# Patient Record
Sex: Female | Born: 1975 | Race: Black or African American | Hispanic: No | Marital: Single | Smoking: Never smoker
Health system: Southern US, Community
[De-identification: ages and names within clinical notes are randomized; demographics above are authoritative.]

## PROBLEM LIST (undated history)

## (undated) DIAGNOSIS — K259 Gastric ulcer, unspecified as acute or chronic, without hemorrhage or perforation: Secondary | ICD-10-CM

---

## 2000-01-12 ENCOUNTER — Emergency Department (HOSPITAL_COMMUNITY): Admission: EM | Admit: 2000-01-12 | Discharge: 2000-01-12 | Payer: Self-pay | Admitting: Emergency Medicine

## 2000-07-24 ENCOUNTER — Emergency Department (HOSPITAL_COMMUNITY): Admission: EM | Admit: 2000-07-24 | Discharge: 2000-07-24 | Payer: Self-pay | Admitting: Emergency Medicine

## 2000-08-18 ENCOUNTER — Emergency Department (HOSPITAL_COMMUNITY): Admission: EM | Admit: 2000-08-18 | Discharge: 2000-08-18 | Payer: Self-pay | Admitting: Emergency Medicine

## 2000-11-02 ENCOUNTER — Emergency Department (HOSPITAL_COMMUNITY): Admission: EM | Admit: 2000-11-02 | Discharge: 2000-11-02 | Payer: Self-pay | Admitting: Emergency Medicine

## 2001-01-12 ENCOUNTER — Emergency Department (HOSPITAL_COMMUNITY): Admission: EM | Admit: 2001-01-12 | Discharge: 2001-01-12 | Payer: Self-pay | Admitting: Emergency Medicine

## 2001-04-14 ENCOUNTER — Encounter: Payer: Self-pay | Admitting: Emergency Medicine

## 2001-04-14 ENCOUNTER — Emergency Department (HOSPITAL_COMMUNITY): Admission: EM | Admit: 2001-04-14 | Discharge: 2001-04-14 | Payer: Self-pay | Admitting: Emergency Medicine

## 2001-08-31 ENCOUNTER — Emergency Department (HOSPITAL_COMMUNITY): Admission: EM | Admit: 2001-08-31 | Discharge: 2001-09-01 | Payer: Self-pay | Admitting: Emergency Medicine

## 2001-09-01 ENCOUNTER — Encounter: Payer: Self-pay | Admitting: Emergency Medicine

## 2002-03-08 ENCOUNTER — Emergency Department (HOSPITAL_COMMUNITY): Admission: EM | Admit: 2002-03-08 | Discharge: 2002-03-08 | Payer: Self-pay | Admitting: *Deleted

## 2002-05-20 ENCOUNTER — Emergency Department (HOSPITAL_COMMUNITY): Admission: EM | Admit: 2002-05-20 | Discharge: 2002-05-20 | Payer: Self-pay | Admitting: Emergency Medicine

## 2002-09-19 ENCOUNTER — Encounter: Payer: Self-pay | Admitting: Emergency Medicine

## 2002-09-19 ENCOUNTER — Emergency Department (HOSPITAL_COMMUNITY): Admission: EM | Admit: 2002-09-19 | Discharge: 2002-09-19 | Payer: Self-pay | Admitting: Emergency Medicine

## 2002-09-29 ENCOUNTER — Encounter: Payer: Self-pay | Admitting: Emergency Medicine

## 2002-09-29 ENCOUNTER — Emergency Department (HOSPITAL_COMMUNITY): Admission: EM | Admit: 2002-09-29 | Discharge: 2002-09-29 | Payer: Self-pay | Admitting: Emergency Medicine

## 2002-12-02 ENCOUNTER — Emergency Department (HOSPITAL_COMMUNITY): Admission: EM | Admit: 2002-12-02 | Discharge: 2002-12-02 | Payer: Self-pay | Admitting: Emergency Medicine

## 2002-12-02 ENCOUNTER — Encounter: Payer: Self-pay | Admitting: Emergency Medicine

## 2003-02-15 ENCOUNTER — Emergency Department (HOSPITAL_COMMUNITY): Admission: EM | Admit: 2003-02-15 | Discharge: 2003-02-16 | Payer: Self-pay | Admitting: Emergency Medicine

## 2003-06-10 ENCOUNTER — Emergency Department (HOSPITAL_COMMUNITY): Admission: EM | Admit: 2003-06-10 | Discharge: 2003-06-10 | Payer: Self-pay | Admitting: Emergency Medicine

## 2003-06-10 ENCOUNTER — Encounter: Payer: Self-pay | Admitting: Emergency Medicine

## 2003-06-28 ENCOUNTER — Emergency Department (HOSPITAL_COMMUNITY): Admission: EM | Admit: 2003-06-28 | Discharge: 2003-06-29 | Payer: Self-pay

## 2003-08-18 ENCOUNTER — Emergency Department (HOSPITAL_COMMUNITY): Admission: EM | Admit: 2003-08-18 | Discharge: 2003-08-18 | Payer: Self-pay | Admitting: Emergency Medicine

## 2003-08-18 ENCOUNTER — Encounter: Payer: Self-pay | Admitting: Emergency Medicine

## 2003-11-01 ENCOUNTER — Emergency Department (HOSPITAL_COMMUNITY): Admission: EM | Admit: 2003-11-01 | Discharge: 2003-11-01 | Payer: Self-pay | Admitting: Emergency Medicine

## 2003-11-02 ENCOUNTER — Emergency Department (HOSPITAL_COMMUNITY): Admission: EM | Admit: 2003-11-02 | Discharge: 2003-11-02 | Payer: Self-pay | Admitting: Emergency Medicine

## 2004-02-01 ENCOUNTER — Emergency Department (HOSPITAL_COMMUNITY): Admission: EM | Admit: 2004-02-01 | Discharge: 2004-02-01 | Payer: Self-pay | Admitting: Emergency Medicine

## 2004-03-16 ENCOUNTER — Emergency Department (HOSPITAL_COMMUNITY): Admission: EM | Admit: 2004-03-16 | Discharge: 2004-03-16 | Payer: Self-pay | Admitting: Emergency Medicine

## 2004-03-19 ENCOUNTER — Emergency Department (HOSPITAL_COMMUNITY): Admission: EM | Admit: 2004-03-19 | Discharge: 2004-03-20 | Payer: Self-pay

## 2005-01-17 ENCOUNTER — Emergency Department (HOSPITAL_COMMUNITY): Admission: EM | Admit: 2005-01-17 | Discharge: 2005-01-17 | Payer: Self-pay | Admitting: Emergency Medicine

## 2005-04-13 ENCOUNTER — Inpatient Hospital Stay (HOSPITAL_COMMUNITY): Admission: AD | Admit: 2005-04-13 | Discharge: 2005-04-13 | Payer: Self-pay | Admitting: *Deleted

## 2005-04-15 ENCOUNTER — Ambulatory Visit (HOSPITAL_COMMUNITY): Admission: RE | Admit: 2005-04-15 | Discharge: 2005-04-15 | Payer: Self-pay | Admitting: *Deleted

## 2005-08-15 ENCOUNTER — Emergency Department (HOSPITAL_COMMUNITY): Admission: EM | Admit: 2005-08-15 | Discharge: 2005-08-15 | Payer: Self-pay | Admitting: Emergency Medicine

## 2005-10-12 ENCOUNTER — Emergency Department (HOSPITAL_COMMUNITY): Admission: EM | Admit: 2005-10-12 | Discharge: 2005-10-12 | Payer: Self-pay | Admitting: Emergency Medicine

## 2005-12-03 ENCOUNTER — Emergency Department (HOSPITAL_COMMUNITY): Admission: EM | Admit: 2005-12-03 | Discharge: 2005-12-03 | Payer: Self-pay | Admitting: Emergency Medicine

## 2006-01-12 ENCOUNTER — Emergency Department (HOSPITAL_COMMUNITY): Admission: EM | Admit: 2006-01-12 | Discharge: 2006-01-12 | Payer: Self-pay | Admitting: Emergency Medicine

## 2006-03-30 ENCOUNTER — Emergency Department (HOSPITAL_COMMUNITY): Admission: EM | Admit: 2006-03-30 | Discharge: 2006-03-30 | Payer: Self-pay | Admitting: Emergency Medicine

## 2006-12-22 ENCOUNTER — Emergency Department (HOSPITAL_COMMUNITY): Admission: EM | Admit: 2006-12-22 | Discharge: 2006-12-22 | Payer: Self-pay | Admitting: Emergency Medicine

## 2007-01-30 ENCOUNTER — Emergency Department (HOSPITAL_COMMUNITY): Admission: EM | Admit: 2007-01-30 | Discharge: 2007-01-30 | Payer: Self-pay | Admitting: Emergency Medicine

## 2008-01-27 ENCOUNTER — Emergency Department (HOSPITAL_COMMUNITY): Admission: EM | Admit: 2008-01-27 | Discharge: 2008-01-27 | Payer: Self-pay | Admitting: Family Medicine

## 2008-04-05 ENCOUNTER — Emergency Department (HOSPITAL_COMMUNITY): Admission: EM | Admit: 2008-04-05 | Discharge: 2008-04-05 | Payer: Self-pay | Admitting: Emergency Medicine

## 2008-05-08 ENCOUNTER — Emergency Department (HOSPITAL_COMMUNITY): Admission: EM | Admit: 2008-05-08 | Discharge: 2008-05-09 | Payer: Self-pay | Admitting: Emergency Medicine

## 2009-01-17 ENCOUNTER — Emergency Department (HOSPITAL_COMMUNITY): Admission: EM | Admit: 2009-01-17 | Discharge: 2009-01-18 | Payer: Self-pay | Admitting: Family Medicine

## 2009-02-23 ENCOUNTER — Emergency Department (HOSPITAL_COMMUNITY): Admission: EM | Admit: 2009-02-23 | Discharge: 2009-02-23 | Payer: Self-pay | Admitting: Emergency Medicine

## 2009-05-26 ENCOUNTER — Inpatient Hospital Stay (HOSPITAL_COMMUNITY): Admission: AD | Admit: 2009-05-26 | Discharge: 2009-05-26 | Payer: Self-pay | Admitting: Obstetrics & Gynecology

## 2009-10-08 ENCOUNTER — Emergency Department (HOSPITAL_COMMUNITY): Admission: EM | Admit: 2009-10-08 | Discharge: 2009-10-09 | Payer: Self-pay | Admitting: Emergency Medicine

## 2009-11-24 ENCOUNTER — Emergency Department (HOSPITAL_COMMUNITY): Admission: EM | Admit: 2009-11-24 | Discharge: 2009-11-24 | Payer: Self-pay | Admitting: Emergency Medicine

## 2011-03-07 LAB — RAPID STREP SCREEN (MED CTR MEBANE ONLY): Streptococcus, Group A Screen (Direct): NEGATIVE

## 2011-03-09 LAB — POCT CARDIAC MARKERS
CKMB, poc: <1 ng/mL — ABNORMAL LOW (ref 1.0–8.0)
Myoglobin, poc: 36.2 ng/mL (ref 12–200)
Troponin i, poc: <0.05 ng/mL (ref 0.00–0.09)

## 2011-03-09 LAB — POCT I-STAT, CHEM 8
BUN: 8 mg/dL (ref 6–23)
Calcium, Ion: 1.18 mmol/L (ref 1.12–1.32)
Chloride: 104 mEq/L (ref 96–112)
Creatinine, Ser: 0.6 mg/dL (ref 0.4–1.2)

## 2011-03-14 LAB — COMPREHENSIVE METABOLIC PANEL
BUN: 8 mg/dL (ref 6–23)
CO2: 27 mEq/L (ref 19–32)
Chloride: 105 mEq/L (ref 96–112)
GFR calc Af Amer: >60 mL/min (ref >60)
Glucose, Bld: 104 mg/dL — ABNORMAL HIGH (ref 70–99)
Potassium: 3.7 mEq/L (ref 3.5–5.1)
Sodium: 138 mEq/L (ref 135–145)
Total Bilirubin: 0.7 mg/dL (ref 0.3–1.2)
Total Protein: 7.3 g/dL (ref 6.0–8.3)

## 2011-03-14 LAB — URINALYSIS, ROUTINE W REFLEX MICROSCOPIC
Bilirubin Urine: NEGATIVE
Glucose, UA: NEGATIVE mg/dL
Hgb urine dipstick: NEGATIVE
Nitrite: NEGATIVE
Specific Gravity, Urine: >1.03 — ABNORMAL HIGH (ref 1.005–1.030)
pH: 5 (ref 5.0–8.0)

## 2011-03-14 LAB — CBC
Hemoglobin: 12.4 g/dL (ref 12.0–15.0)
MCHC: 32.3 g/dL (ref 30.0–36.0)
Platelets: 252 10*3/uL (ref 150–400)

## 2011-03-22 LAB — POCT CARDIAC MARKERS
CKMB, poc: 1.1 ng/mL (ref 1.0–8.0)
Myoglobin, poc: 32.1 ng/mL (ref 12–200)
Myoglobin, poc: 39.8 ng/mL (ref 12–200)
Troponin i, poc: <0.05 ng/mL (ref 0.00–0.09)

## 2011-03-22 LAB — DIFFERENTIAL
Basophils Absolute: 0 10*3/uL (ref 0.0–0.1)
Eosinophils Relative: 2 % (ref 0–5)
Neutro Abs: 3.3 10*3/uL (ref 1.7–7.7)
Neutrophils Relative %: 48 % (ref 43–77)

## 2011-03-22 LAB — CBC
HCT: 36.2 % (ref 36.0–46.0)
MCHC: 32.5 g/dL (ref 30.0–36.0)
MCV: 77.3 fL — ABNORMAL LOW (ref 78.0–100.0)
Platelets: 260 10*3/uL (ref 150–400)
RDW: 14.1 % (ref 11.5–15.5)

## 2011-03-22 LAB — POCT PREGNANCY, URINE: Preg Test, Ur: NEGATIVE

## 2011-03-22 LAB — URINE MICROSCOPIC-ADD ON

## 2011-03-22 LAB — URINALYSIS, ROUTINE W REFLEX MICROSCOPIC
Bilirubin Urine: NEGATIVE
Hgb urine dipstick: NEGATIVE
Nitrite: NEGATIVE
pH: 7 (ref 5.0–8.0)

## 2011-03-22 LAB — URINE CULTURE

## 2011-03-22 LAB — POCT I-STAT, CHEM 8
Chloride: 106 mEq/L (ref 96–112)
Creatinine, Ser: 0.7 mg/dL (ref 0.4–1.2)
HCT: 40 % (ref 36.0–46.0)
Hemoglobin: 13.6 g/dL (ref 12.0–15.0)
Potassium: 4.6 mEq/L (ref 3.5–5.1)
Sodium: 140 mEq/L (ref 135–145)

## 2011-08-26 LAB — POCT URINALYSIS DIP (DEVICE)
Bilirubin Urine: NEGATIVE
Ketones, ur: NEGATIVE
Operator id: 270961
Specific Gravity, Urine: 1.025

## 2011-08-26 LAB — POCT PREGNANCY, URINE: Operator id: 270961

## 2011-09-01 LAB — RAPID STREP SCREEN (MED CTR MEBANE ONLY): Streptococcus, Group A Screen (Direct): NEGATIVE

## 2014-11-01 ENCOUNTER — Emergency Department (HOSPITAL_COMMUNITY)
Admission: EM | Admit: 2014-11-01 | Discharge: 2014-11-02 | Disposition: A | Discharge status: Home / Self Care | Payer: Managed Care, Other (non HMO) | Attending: Emergency Medicine | Admitting: Emergency Medicine

## 2014-11-01 ENCOUNTER — Emergency Department (HOSPITAL_COMMUNITY): Payer: Managed Care, Other (non HMO)

## 2014-11-01 ENCOUNTER — Encounter (HOSPITAL_COMMUNITY): Payer: Self-pay | Admitting: Emergency Medicine

## 2014-11-01 DIAGNOSIS — K920 Hematemesis: Secondary | ICD-10-CM | POA: Diagnosis not present

## 2014-11-01 DIAGNOSIS — K921 Melena: Secondary | ICD-10-CM

## 2014-11-01 DIAGNOSIS — R109 Unspecified abdominal pain: Secondary | ICD-10-CM

## 2014-11-01 DIAGNOSIS — R197 Diarrhea, unspecified: Secondary | ICD-10-CM | POA: Insufficient documentation

## 2014-11-01 DIAGNOSIS — Z3202 Encounter for pregnancy test, result negative: Secondary | ICD-10-CM | POA: Insufficient documentation

## 2014-11-01 DIAGNOSIS — Z79899 Other long term (current) drug therapy: Secondary | ICD-10-CM | POA: Diagnosis not present

## 2014-11-01 HISTORY — DX: Gastric ulcer, unspecified as acute or chronic, without hemorrhage or perforation: K25.9

## 2014-11-01 LAB — COMPREHENSIVE METABOLIC PANEL
ALT: 25 U/L (ref 0–35)
AST: 21 U/L (ref 0–37)
Albumin: 3.7 g/dL (ref 3.5–5.2)
Alkaline Phosphatase: 70 U/L (ref 39–117)
Anion gap: 13 (ref 5–15)
BUN: 8 mg/dL (ref 6–23)
CALCIUM: 9.2 mg/dL (ref 8.4–10.5)
CO2: 24 mEq/L (ref 19–32)
CREATININE: 0.68 mg/dL (ref 0.50–1.10)
Chloride: 106 mEq/L (ref 96–112)
GFR calc Af Amer: >90 mL/min (ref >90)
Glucose, Bld: 82 mg/dL (ref 70–99)
Potassium: 3.9 mEq/L (ref 3.7–5.3)
SODIUM: 143 meq/L (ref 137–147)
TOTAL PROTEIN: 7.4 g/dL (ref 6.0–8.3)
Total Bilirubin: 0.3 mg/dL (ref 0.3–1.2)

## 2014-11-01 LAB — CBC WITH DIFFERENTIAL/PLATELET
BASOS ABS: 0 10*3/uL (ref 0.0–0.1)
BASOS PCT: 0 % (ref 0–1)
EOS ABS: 0.1 10*3/uL (ref 0.0–0.7)
EOS PCT: 1 % (ref 0–5)
HEMATOCRIT: 36.8 % (ref 36.0–46.0)
Hemoglobin: 11.3 g/dL — ABNORMAL LOW (ref 12.0–15.0)
Lymphocytes Relative: 29 % (ref 12–46)
Lymphs Abs: 2.8 10*3/uL (ref 0.7–4.0)
MCH: 23.6 pg — AB (ref 26.0–34.0)
MCHC: 30.7 g/dL (ref 30.0–36.0)
MCV: 76.8 fL — AB (ref 78.0–100.0)
MONO ABS: 0.7 10*3/uL (ref 0.1–1.0)
Monocytes Relative: 7 % (ref 3–12)
Neutro Abs: 6.3 10*3/uL (ref 1.7–7.7)
Neutrophils Relative %: 63 % (ref 43–77)
PLATELETS: 305 10*3/uL (ref 150–400)
RBC: 4.79 MIL/uL (ref 3.87–5.11)
RDW: 14.2 % (ref 11.5–15.5)
WBC: 9.9 10*3/uL (ref 4.0–10.5)

## 2014-11-01 LAB — LIPASE, BLOOD: LIPASE: 31 U/L (ref 11–59)

## 2014-11-01 MED ORDER — SODIUM CHLORIDE 0.9 % IV BOLUS (SEPSIS)
1000.0000 mL | INTRAVENOUS | Status: AC
  Administered 2014-11-02: 1000 mL via INTRAVENOUS

## 2014-11-01 MED ORDER — IOHEXOL 300 MG/ML  SOLN
50.0000 mL | Freq: Once | INTRAMUSCULAR | Status: AC | PRN
Start: 2014-11-01 — End: 2014-11-01
  Administered 2014-11-01: 50 mL via ORAL

## 2014-11-01 MED ORDER — ONDANSETRON HCL 4 MG/2ML IJ SOLN
4.0000 mg | Freq: Once | INTRAMUSCULAR | Status: AC
  Administered 2014-11-02: 4 mg via INTRAVENOUS
  Filled 2014-11-01: qty 2

## 2014-11-01 MED ORDER — HYDROMORPHONE HCL 1 MG/ML IJ SOLN
0.5000 mg | Freq: Once | INTRAMUSCULAR | Status: AC
  Administered 2014-11-02: 0.5 mg via INTRAVENOUS
  Filled 2014-11-01: qty 1

## 2014-11-01 NOTE — ED Notes (Signed)
Pt states she episodes today of loose stool and vomiting both with dark blood. L abd pain.

## 2014-11-01 NOTE — ED Provider Notes (Signed)
CSN: [id number]     Arrival date & time 11/01/14  2226 History   First MD Initiated Contact with Patient 11/01/14 2306     Chief Complaint  Patient presents with  . Hematemesis  . Rectal Bleeding     (Consider location/radiation/quality/duration/timing/severity/associated sxs/prior Treatment) Patient is a 38 y.o. female presenting with hematochezia. The history is provided by the patient.  Rectal Bleeding Quality:  Maroon Amount:  Scant Duration:  1 day Timing: once. Progression:  Unchanged Chronicity:  New Context: diarrhea and spontaneously   Relieved by:  Nothing Worsened by:  Nothing tried Ineffective treatments:  None tried Associated symptoms: abdominal pain and vomiting   Associated symptoms: no dizziness and no fever   Abdominal pain:    Location:  LUQ   Quality:  Sharp   Severity:  Moderate   Onset quality:  Gradual   Duration:  6 months   Timing:  Intermittent   Progression:  Unchanged   Chronicity:  Chronic   Past Medical History  Diagnosis Date  . Multiple gastric ulcers    No past surgical history on file. No family history on file. History  Substance Use Topics  . Smoking status: Never Smoker   . Smokeless tobacco: Not on file  . Alcohol Use: No   OB History    No data available     Review of Systems  Constitutional: Negative for fever and fatigue.  HENT: Negative for congestion and drooling.   Eyes: Negative for pain.  Respiratory: Negative for cough and shortness of breath.   Cardiovascular: Negative for chest pain.  Gastrointestinal: Positive for nausea, vomiting, abdominal pain, diarrhea and hematochezia.  Genitourinary: Negative for dysuria and hematuria.       Blood in emesis and stool  Musculoskeletal: Negative for back pain, gait problem and neck pain.  Skin: Negative for color change.  Neurological: Negative for dizziness and headaches.  Hematological: Negative for adenopathy.  Psychiatric/Behavioral: Negative for behavioral  problems.  All other systems reviewed and are negative.     Allergies  Food  Home Medications   Prior to Admission medications   Medication Sig Start Date End Date Taking? Authorizing Provider  ibuprofen (ADVIL,MOTRIN) 200 MG tablet Take 400 mg by mouth every 6 (six) hours as needed (for pain.).   Yes Historical Provider, MD  Multiple Vitamin (MULTIVITAMIN WITH MINERALS) TABS tablet Take 1 tablet by mouth daily.   Yes Historical Provider, MD   BP 149/85 mmHg  Pulse 86  Temp(Src) 98.1 F (36.7 C) (Oral)  Resp 18  Ht 5\' 2"  (1.575 m)  Wt 200 lb (90.719 kg)  BMI 36.57 kg/m2  SpO2 99%  LMP 10/13/2014 Physical Exam  Constitutional: She is oriented to person, place, and time. She appears well-developed and well-nourished.  HENT:  Head: Normocephalic and atraumatic.  Mouth/Throat: Oropharynx is clear and moist. No oropharyngeal exudate.  Eyes: Conjunctivae and EOM are normal. Pupils are equal, round, and reactive to light.  Neck: Normal range of motion. Neck supple.  Cardiovascular: Normal rate, regular rhythm, normal heart sounds and intact distal pulses.  Exam reveals no gallop and no friction rub.   No murmur heard. Pulmonary/Chest: Effort normal and breath sounds normal. No respiratory distress. She has no wheezes.  Abdominal: Soft. Bowel sounds are normal. There is tenderness (mild tenderness to palpation of the left upper and mid quadrant.). There is no rebound and no guarding.  Genitourinary:  Normal-appearing external rectum. Normal rectal exam. No stool on the glove just some rectal  mucus which is nonbloody in appearance. Hemoccult negative.  Musculoskeletal: Normal range of motion. She exhibits no edema or tenderness.  Neurological: She is alert and oriented to person, place, and time.  Skin: Skin is warm and dry.  Psychiatric: She has a normal mood and affect. Her behavior is normal.  Nursing note and vitals reviewed.   ED Course  Procedures (including critical care  time) Labs Review Labs Reviewed  CBC WITH DIFFERENTIAL - Abnormal; Notable for the following:    Hemoglobin 11.3 (*)    MCV 76.8 (*)    MCH 23.6 (*)    All other components within normal limits  URINALYSIS, ROUTINE W REFLEX MICROSCOPIC - Abnormal; Notable for the following:    Leukocytes, UA TRACE (*)    All other components within normal limits  URINE MICROSCOPIC-ADD ON - Abnormal; Notable for the following:    Squamous Epithelial / LPF FEW (*)    All other components within normal limits  COMPREHENSIVE METABOLIC PANEL  LIPASE, BLOOD  OCCULT BLOOD X 1 CARD TO LAB, STOOL  POC URINE PREG, ED    Imaging Review Ct Abdomen Pelvis W Contrast  11/02/2014   CLINICAL DATA:  Acute onset of left-sided abdominal pain, with episode of vomiting. Several episodes of loose stool. Hematemesis and hematochezia. Initial encounter.  EXAM: CT ABDOMEN AND PELVIS WITH CONTRAST  TECHNIQUE: Multidetector CT imaging of the abdomen and pelvis was performed using the standard protocol following bolus administration of intravenous contrast.  CONTRAST:  50mL OMNIPAQUE IOHEXOL 300 MG/ML SOLN, 100mL OMNIPAQUE IOHEXOL 300 MG/ML SOLN  COMPARISON:  Pelvic ultrasound performed 04/15/2005  FINDINGS: The visualized lung bases are clear.  The liver and spleen are unremarkable in appearance. The gallbladder is within normal limits. The pancreas and adrenal glands are unremarkable.  The kidneys are unremarkable in appearance. There is no evidence of hydronephrosis. No renal or ureteral stones are seen. No perinephric stranding is appreciated.  No free fluid is identified. The small bowel is unremarkable in appearance. The stomach is within normal limits. No acute vascular abnormalities are seen.  The appendix is normal in caliber, without evidence for appendicitis. Contrast progresses to the level of the proximal transverse colon. The colon is unremarkable in appearance.  The bladder is mildly distended and grossly unremarkable.  There is a somewhat unusual heterogeneous appearance to the cervix. Would correlate with Pap smear results. There is suggestion of a fibroid at the uterine fundus, difficult to fully characterize. The ovaries are relatively symmetric. No suspicious adnexal masses are seen. No inguinal lymphadenopathy is seen.  No acute osseous abnormalities are identified.  IMPRESSION: 1. No acute abnormality seen within the abdomen or pelvis. 2. Somewhat unusual heterogeneous appearance to the cervix; a mass cannot be entirely excluded. Would correlate with Pap smear results. 3. Suggestion of fibroid at the uterine fundus.   Electronically Signed   By: Roanna RaiderJeffery  Chang M.D.   On: 11/02/2014 02:02     EKG Interpretation None      MDM   Final diagnoses:  Left sided abdominal pain  Hematemesis with nausea  Bloody stool    11:19 PM 38 y.o. female who presents with left-sided abdominal pain, hematemesis, and blood in her stool. She states that she has had intermittent left-sided abdominal pain for the last 6 months. She states that she had an episode this afternoon and afterwards had several episodes of diarrhea. One of which was green in appearance and one with a small amount of dark red blood. She  also had 2 episodes of emesis, one of which had a small amount of dark red blood. She is afebrile and vital signs are unremarkable here. We'll get screening labs and imaging.  Hemoccult negative.  3:41 AM: I interpreted/reviewed the labs and/or imaging which were non-contributory.  She has not had any vomiting or bloody stools here. She was incidentally found to have a heterogeneous appearance of her cervix. I recommended that she follow up routinely with OB/GYN for a Pap smear. Also recommended that she follow-up with GI if she has ongoing blood in her stool. I have discussed the diagnosis/risks/treatment options with the patient and believe the pt to be eligible for discharge home to follow-up with GI and obgyn. We also  discussed returning to the ED immediately if new or worsening sx occur. We discussed the sx which are most concerning (e.g., worsening bloody stools or hematemesis, worsening abd pain, fever) that necessitate immediate return. Medications administered to the patient during their visit and any new prescriptions provided to the patient are listed below.  Medications given during this visit Medications  sodium chloride 0.9 % bolus 1,000 mL (1,000 mLs Intravenous New Bag/Given 11/02/14 0129)  ondansetron (ZOFRAN) injection 4 mg (4 mg Intravenous Given 11/02/14 0130)  HYDROmorphone (DILAUDID) injection 0.5 mg (0.5 mg Intravenous Given 11/02/14 0132)  iohexol (OMNIPAQUE) 300 MG/ML solution 50 mL (50 mLs Oral Contrast Given 11/01/14 2343)  iohexol (OMNIPAQUE) 300 MG/ML solution 100 mL (100 mLs Intravenous Contrast Given 11/02/14 0147)    New Prescriptions   OMEPRAZOLE (PRILOSEC) 20 MG CAPSULE    Take 1 capsule (20 mg total) by mouth daily.     Purvis Sheffield, MD 11/02/14 [phone]

## 2014-11-02 LAB — URINALYSIS, ROUTINE W REFLEX MICROSCOPIC
Bilirubin Urine: NEGATIVE
GLUCOSE, UA: NEGATIVE mg/dL
Hgb urine dipstick: NEGATIVE
KETONES UR: NEGATIVE mg/dL
NITRITE: NEGATIVE
PH: 5.5 (ref 5.0–8.0)
PROTEIN: NEGATIVE mg/dL
Specific Gravity, Urine: 1.011 (ref 1.005–1.030)
Urobilinogen, UA: 0.2 mg/dL (ref 0.0–1.0)

## 2014-11-02 LAB — URINE MICROSCOPIC-ADD ON

## 2014-11-02 LAB — POC URINE PREG, ED: PREG TEST UR: NEGATIVE

## 2014-11-02 MED ORDER — ONDANSETRON 4 MG PO TBDP
4.0000 mg | ORAL_TABLET | Freq: Once | ORAL | Status: AC
  Administered 2014-11-02: 4 mg via ORAL
  Filled 2014-11-02: qty 1

## 2014-11-02 MED ORDER — IOHEXOL 300 MG/ML  SOLN
100.0000 mL | Freq: Once | INTRAMUSCULAR | Status: AC | PRN
  Administered 2014-11-02: 100 mL via INTRAVENOUS

## 2014-11-02 MED ORDER — ONDANSETRON 4 MG PO TBDP: ORAL_TABLET | ORAL | Status: AC

## 2014-11-02 MED ORDER — OMEPRAZOLE 20 MG PO CPDR: 20.0000 mg | DELAYED_RELEASE_CAPSULE | Freq: Every day | ORAL | Status: AC

## 2014-11-02 NOTE — ED Notes (Signed)
Pt got up to get dressed for discharge and she began vomiting clear liquid,  This writer informed Dr Romeo AppleHarrison and he gave verbal order for zofran ODT.

## 2014-11-02 NOTE — ED Notes (Signed)
Patient transported to CT 

## 2016-11-06 IMAGING — CT CT ABD-PELV W/ CM
1 of 2 series · 15 of 32 positions shown, 19 images · IV contrast (OMNIPAQUE 300)
Comparison: Pelvic ultrasound performed 04/15/2005

CLINICAL DATA: Acute onset of left-sided abdominal pain, with
episode of vomiting. Several episodes of loose stool. Hematemesis
and hematochezia. Initial encounter.

EXAM:
CT ABDOMEN AND PELVIS WITH CONTRAST
TECHNIQUE: Multidetector CT imaging of the abdomen and pelvis was performed
using the standard protocol following bolus administration of
intravenous contrast.
CONTRAST:  50mL OMNIPAQUE IOHEXOL 300 MG/ML SOLN, 100mL OMNIPAQUE
IOHEXOL 300 MG/ML SOLN

[Series 2: abd/pel with · axial · 0.79mm/px · z∈[-486,-71]mm · 15 of 91 slices shown, 19 images]
[im 4/91  soft-tissue]
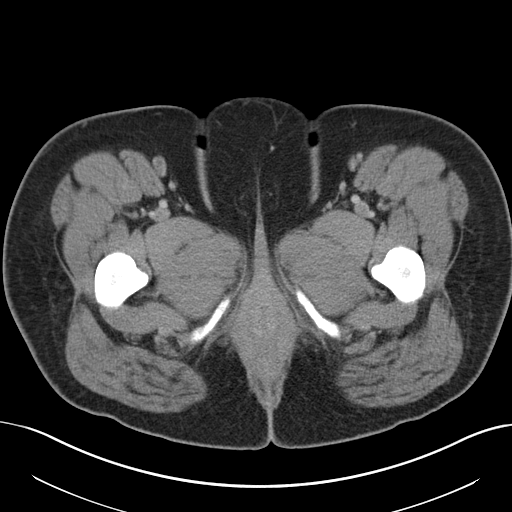
[im 4/91  bone]
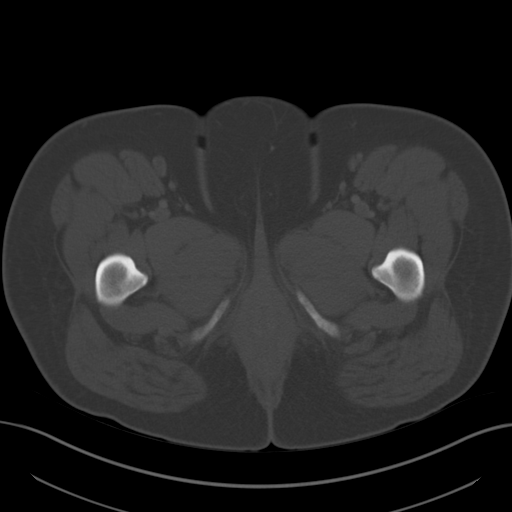
[im 12/91  soft-tissue]
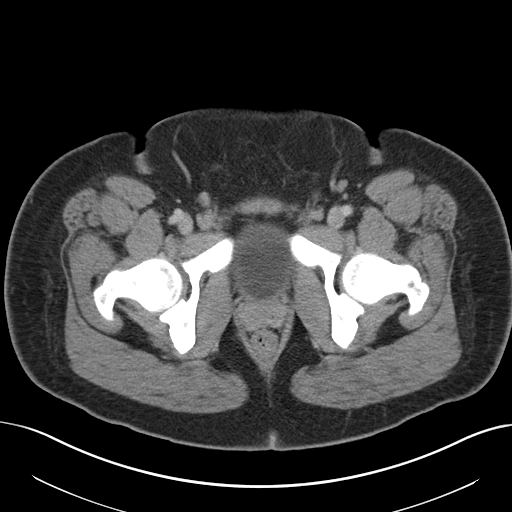
[im 20/91  soft-tissue]
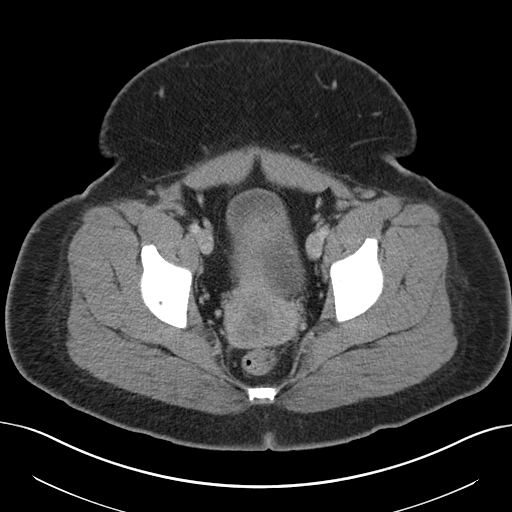
[im 24/91  soft-tissue]
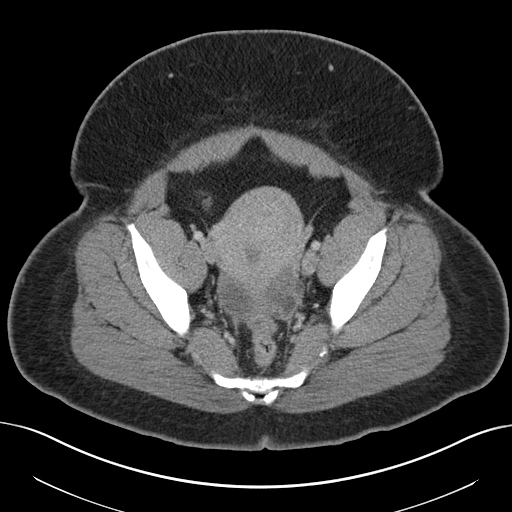
[im 32/91  soft-tissue]
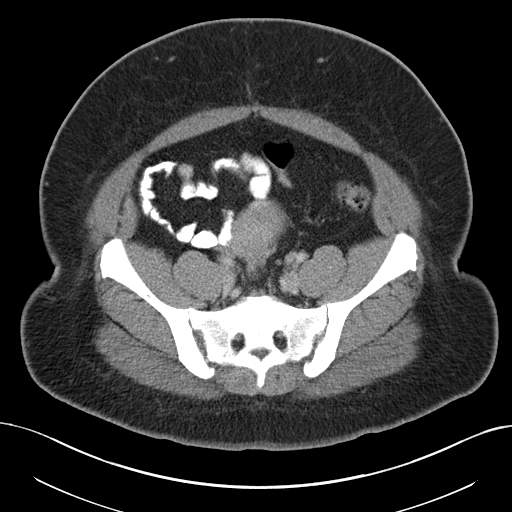
[im 40/91  soft-tissue]
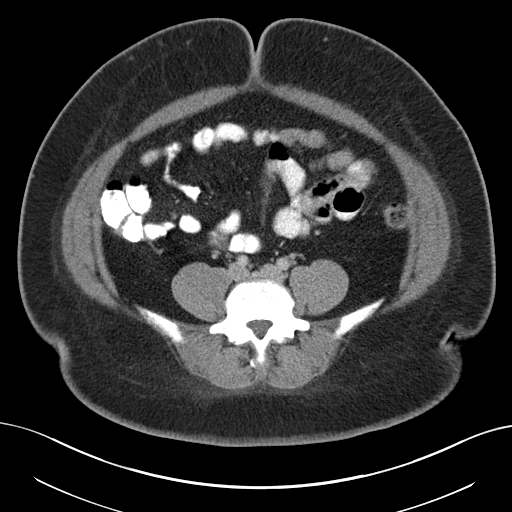
[im 47/91  soft-tissue]
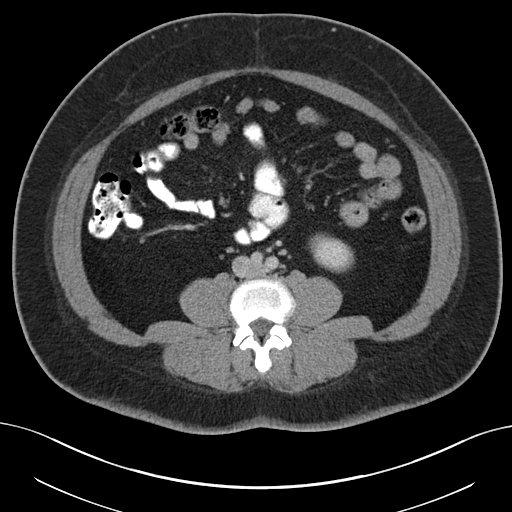
[im 51/91  soft-tissue]
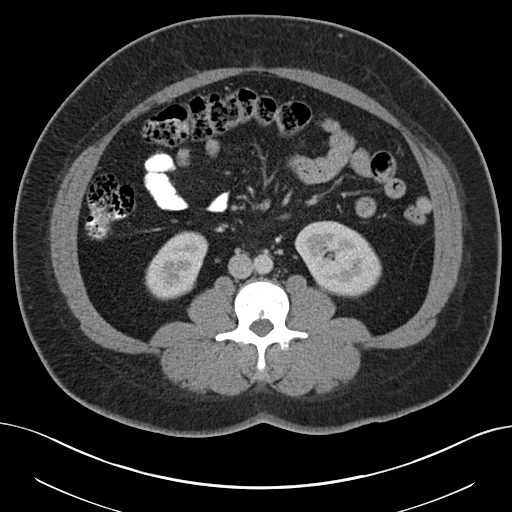
[im 59/91  soft-tissue]
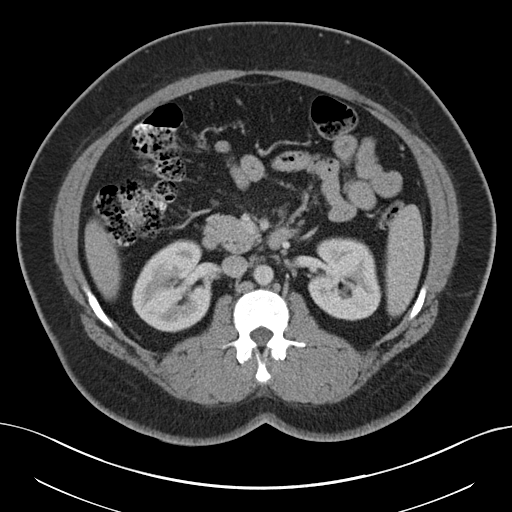
[im 59/91  bone]
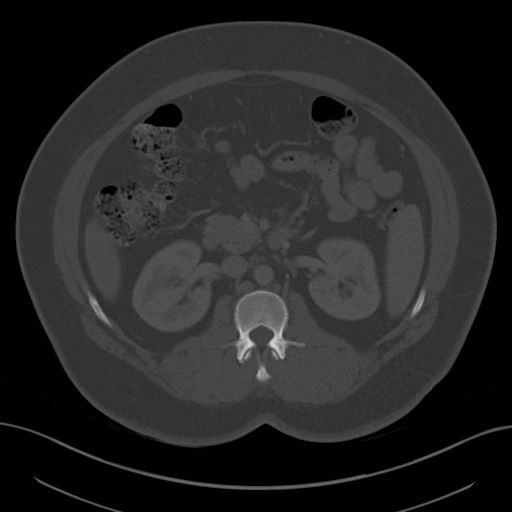
[im 67/91  soft-tissue]
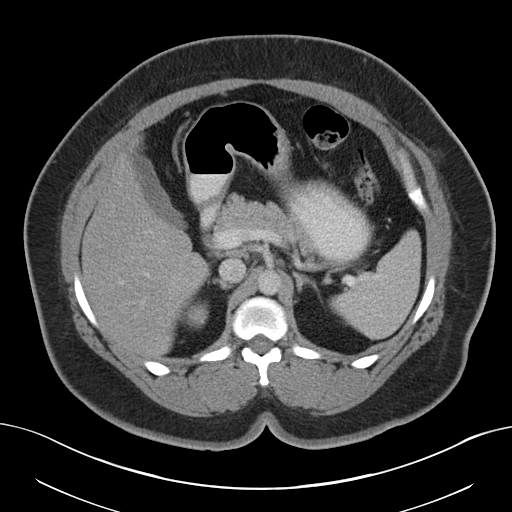
[im 71/91  soft-tissue]
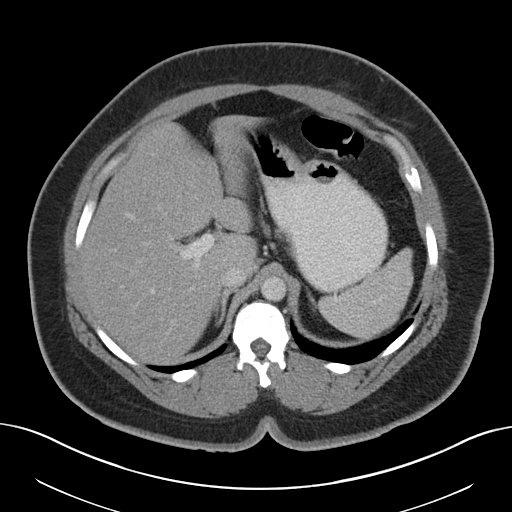
[im 75/91  lung]
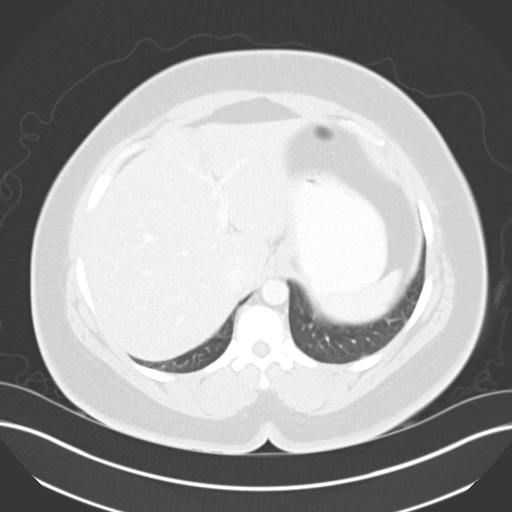
[im 79/91  soft-tissue]
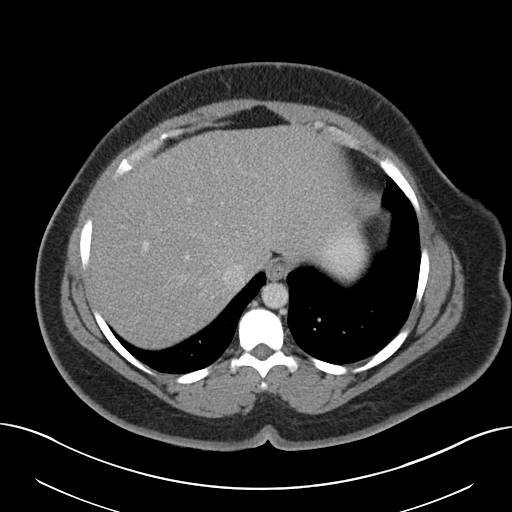
[im 79/91  lung]
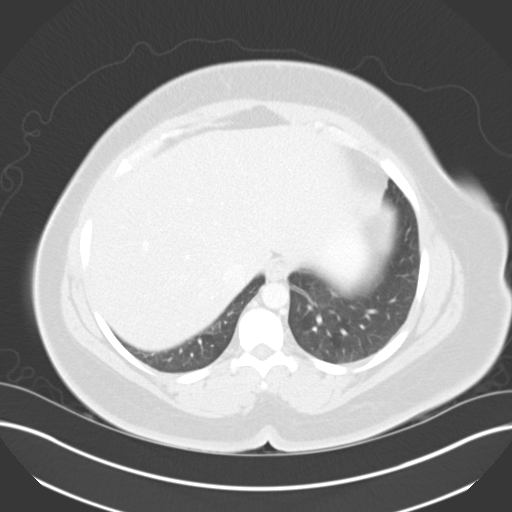
[im 83/91  lung]
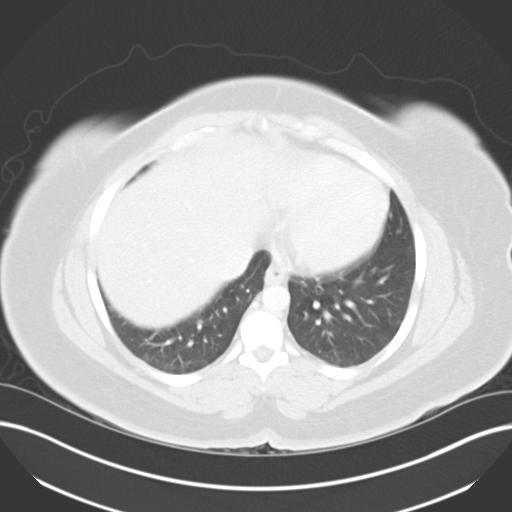
[im 87/91  soft-tissue]
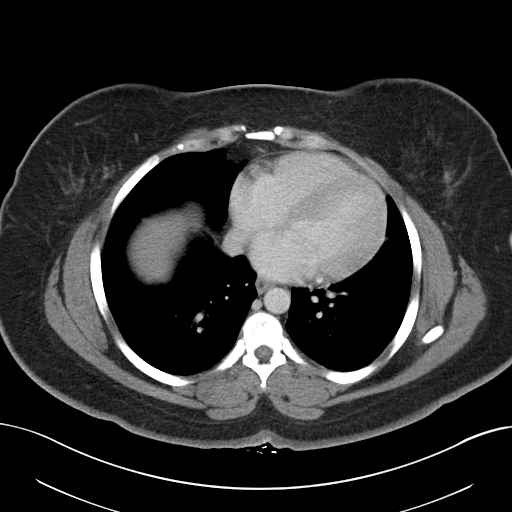
[im 87/91  lung]
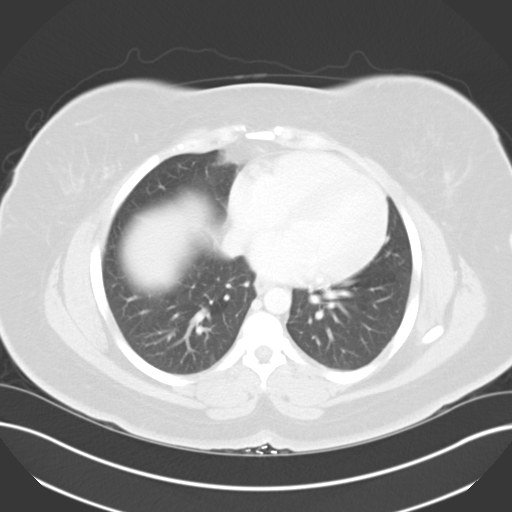

[15 of 32 positions shown; findings below may reference images not displayed]

FINDINGS: The visualized lung bases are clear.

The liver and spleen are unremarkable in appearance. The gallbladder
is within normal limits. The pancreas and adrenal glands are
unremarkable.

The kidneys are unremarkable in appearance. There is no evidence of
hydronephrosis. No renal or ureteral stones are seen. No perinephric
stranding is appreciated.

No free fluid is identified. The small bowel is unremarkable in
appearance. The stomach is within normal limits. No acute vascular
abnormalities are seen.

The appendix is normal in caliber, without evidence for
appendicitis. Contrast progresses to the level of the proximal
transverse colon. The colon is unremarkable in appearance.

The bladder is mildly distended and grossly unremarkable. There is a
somewhat unusual heterogeneous appearance to the cervix. Would
correlate with Pap smear results. There is suggestion of a fibroid
at the uterine fundus, difficult to fully characterize. The ovaries
are relatively symmetric. No suspicious adnexal masses are seen. No
inguinal lymphadenopathy is seen.

No acute osseous abnormalities are identified.
IMPRESSION: 1. No acute abnormality seen within the abdomen or pelvis.
2. Somewhat unusual heterogeneous appearance to the cervix; a mass
cannot be entirely excluded. Would correlate with Pap smear results.
3. Suggestion of fibroid at the uterine fundus.
